# Patient Record
Sex: Female | Born: 1950 | Race: Black or African American | Hispanic: No | Marital: Single | Smoking: Former smoker
Health system: Southern US, Community
[De-identification: ages and names within clinical notes are randomized; demographics above are authoritative.]

## PROBLEM LIST (undated history)

## (undated) DIAGNOSIS — I10 Essential (primary) hypertension: Secondary | ICD-10-CM

## (undated) HISTORY — PX: TUBAL LIGATION: SHX77

---

## 2011-03-12 ENCOUNTER — Emergency Department (HOSPITAL_COMMUNITY)
Admission: EM | Admit: 2011-03-12 | Discharge: 2011-03-13 | Disposition: A | Discharge status: Home / Self Care | Payer: Self-pay | Attending: Emergency Medicine | Admitting: Emergency Medicine

## 2011-03-12 DIAGNOSIS — N739 Female pelvic inflammatory disease, unspecified: Secondary | ICD-10-CM | POA: Insufficient documentation

## 2011-03-13 LAB — URINALYSIS, ROUTINE W REFLEX MICROSCOPIC
Bilirubin Urine: NEGATIVE
Glucose, UA: NEGATIVE mg/dL
Leukocytes, UA: NEGATIVE
Nitrite: NEGATIVE
Specific Gravity, Urine: 1.026 (ref 1.005–1.030)
pH: 6 (ref 5.0–8.0)

## 2011-03-13 LAB — WET PREP, GENITAL: Trich, Wet Prep: NONE SEEN

## 2013-01-26 ENCOUNTER — Encounter (HOSPITAL_COMMUNITY): Payer: Self-pay | Admitting: *Deleted

## 2013-01-26 ENCOUNTER — Emergency Department (HOSPITAL_COMMUNITY)
Admission: EM | Admit: 2013-01-26 | Discharge: 2013-01-26 | Disposition: A | Discharge status: Home / Self Care | Payer: Self-pay | Attending: Emergency Medicine | Admitting: Emergency Medicine

## 2013-01-26 DIAGNOSIS — Z87891 Personal history of nicotine dependence: Secondary | ICD-10-CM | POA: Insufficient documentation

## 2013-01-26 DIAGNOSIS — L299 Pruritus, unspecified: Secondary | ICD-10-CM | POA: Insufficient documentation

## 2013-01-26 DIAGNOSIS — L259 Unspecified contact dermatitis, unspecified cause: Secondary | ICD-10-CM | POA: Insufficient documentation

## 2013-01-26 MED ORDER — PREDNISONE 20 MG PO TABS
60.0000 mg | ORAL_TABLET | Freq: Once | ORAL | Status: AC
Start: 2013-01-26 — End: 2013-01-26
  Administered 2013-01-26: 60 mg via ORAL
  Filled 2013-01-26: qty 3

## 2013-01-26 MED ORDER — PREDNISONE 10 MG PO TABS: 40.0000 mg | ORAL_TABLET | Freq: Every day | ORAL | Status: DC

## 2013-01-26 NOTE — ED Notes (Signed)
Pt states that she was out in the sun on Friday and while she was out in the sun she also had a chair massage; pt states that after the massage she noted a rash to her arms, chest and back; pt describes it as mildly itching, red raised bumps; pt states that she has taken an oatmeal type bath and taken Benadryl with no improvement; pt denies any respiratory symptoms with rash.

## 2013-01-26 NOTE — ED Provider Notes (Signed)
History     CSN: [id number]  Arrival date & time 01/26/13  2224   First MD Initiated Contact with Patient 01/26/13 2257      Chief Complaint  Patient presents with  . Rash    (Consider location/radiation/quality/duration/timing/severity/associated sxs/prior treatment) HPI Comments: Patient presents with rash. She states that she was out in the sun over the weekend and also who got a chair massage. She states that yesterday she developed a rash that has been increasingly red and itchy. She states it's on her arms and her trunk. She denies any swelling of her lips or tongue. She denies any shortness of breath. She has had some of allergic reactions in the past. She's tried Benadryl and over the past without improvement.  Patient is a 62 y.o. female presenting with rash.  Rash Associated symptoms: no abdominal pain, no diarrhea, no fatigue, no fever, no headaches, no joint pain, no nausea, no shortness of breath and not vomiting     History reviewed. No pertinent past medical history.  Past Surgical History  Procedure Laterality Date  . Tubal ligation      No family history on file.  History  Substance Use Topics  . Smoking status: Former Games developer  . Smokeless tobacco: Not on file  . Alcohol Use: No    OB History   Grav Para Term Preterm Abortions TAB SAB Ect Mult Living                  Review of Systems  Constitutional: Negative for fever, chills, diaphoresis and fatigue.  HENT: Negative for congestion, rhinorrhea and sneezing.   Eyes: Negative.   Respiratory: Negative for cough, chest tightness and shortness of breath.   Cardiovascular: Negative for chest pain and leg swelling.  Gastrointestinal: Negative for nausea, vomiting, abdominal pain, diarrhea and blood in stool.  Genitourinary: Negative for frequency, hematuria, flank pain and difficulty urinating.  Musculoskeletal: Negative for back pain and arthralgias.  Skin: Positive for rash.  Neurological: Negative  for dizziness, speech difficulty, weakness, numbness and headaches.    Allergies  Demerol  Home Medications   Current Outpatient Rx  Name  Route  Sig  Dispense  Refill  . predniSONE (DELTASONE) 10 MG tablet   Oral   Take 4 tablets (40 mg total) by mouth daily.   20 tablet   0     BP 160/110  Pulse 95  Temp(Src) 98.7 F (37.1 C) (Oral)  Resp 18  Ht 5\' 8"  (1.727 m)  Wt 162 lb (73.483 kg)  BMI 24.64 kg/m2  SpO2 98%  Physical Exam  Constitutional: She is oriented to person, place, and time. She appears well-developed and well-nourished.  HENT:  Head: Normocephalic and atraumatic.  Eyes: Pupils are equal, round, and reactive to light.  Neck: Normal range of motion. Neck supple.  Cardiovascular: Normal rate, regular rhythm and normal heart sounds.   Pulmonary/Chest: Effort normal and breath sounds normal. No respiratory distress. She has no wheezes. She has no rales. She exhibits no tenderness.  Abdominal: Soft. Bowel sounds are normal. There is no tenderness. There is no rebound and no guarding.  Musculoskeletal: Normal range of motion. She exhibits no edema.  Lymphadenopathy:    She has no cervical adenopathy.  Neurological: She is alert and oriented to person, place, and time.  Skin: Skin is warm and dry. Rash (Diffuse maculopapular type rash. It is blanching. It's confluent in areas. There's no petechiae purpura or vesicular lesions. There is no rash on  the palms and soles.) noted.  Psychiatric: She has a normal mood and affect.    ED Course  Procedures (including critical care time)  Labs Reviewed - No data to display No results found.   1. Contact dermatitis       MDM  Patient is given a short course of steroids. She was advised to followup with her primary care physician or return here as needed if her symptoms are not improving or for any worsening symptoms.        Rolan Bucco, MD 01/26/13 [phone]

## 2014-01-22 ENCOUNTER — Emergency Department (INDEPENDENT_AMBULATORY_CARE_PROVIDER_SITE_OTHER)
Admission: EM | Admit: 2014-01-22 | Discharge: 2014-01-22 | Disposition: A | Discharge status: Home / Self Care | Payer: Self-pay | Source: Home / Self Care | Attending: Emergency Medicine | Admitting: Emergency Medicine

## 2014-01-22 ENCOUNTER — Encounter (HOSPITAL_COMMUNITY): Payer: Self-pay | Admitting: Emergency Medicine

## 2014-01-22 DIAGNOSIS — L230 Allergic contact dermatitis due to metals: Secondary | ICD-10-CM

## 2014-01-22 DIAGNOSIS — I10 Essential (primary) hypertension: Secondary | ICD-10-CM

## 2014-01-22 MED ORDER — PREDNISONE 10 MG PO TABS: ORAL_TABLET | ORAL | Status: DC

## 2014-01-22 NOTE — ED Provider Notes (Signed)
CSN: [id number]     Arrival date & time 01/22/14  1824 History   First MD Initiated Contact with Patient 01/22/14 1904     Chief Complaint  Patient presents with  . Rash   (Consider location/radiation/quality/duration/timing/severity/associated sxs/prior Treatment) HPI Comments: Patient states she has had intermittent episodes of a red, raised pruritic rash over the past several weeks. She notices it around her neck when she wears certain pieces of jewelry. Recently she has noticed area on anterior abdomen after buying and wearing a new pair of blue jeans and area on midback after buying a new brassiere. Has occasionally taken a dose of prednisone (left from old Rx) for the rash and rash will then resolve.  Denies any involvement of face, lips or tongue.  States she has no visible rash today as most recent episode has faded, but wishes to be evaluated for the cause of the rash.   The history is provided by the patient.    History reviewed. No pertinent past medical history. Past Surgical History  Procedure Laterality Date  . Tubal ligation     History reviewed. No pertinent family history. History  Substance Use Topics  . Smoking status: Former Games developermoker  . Smokeless tobacco: Not on file  . Alcohol Use: No   OB History   Grav Para Term Preterm Abortions TAB SAB Ect Mult Living                 Review of Systems  All other systems reviewed and are negative.   Allergies  Demerol  Home Medications   Prior to Admission medications   Medication Sig Start Date End Date Taking? Authorizing Provider  predniSONE (DELTASONE) 10 MG tablet Take 4 tablets (40 mg total) by mouth daily. 01/26/13   Rolan BuccoMelanie Belfi, MD  predniSONE (DELTASONE) 10 MG tablet 1-2 tablets po QD as needed for allergic skin reaction 01/22/14   Jess BartersJennifer Lee Presson, PA   BP 187/114  Pulse 83  Temp(Src) 98.5 F (36.9 C) (Oral)  Resp 14  SpO2 96% Physical Exam  Nursing note and vitals reviewed. Constitutional: She  is oriented to person, place, and time. She appears well-developed and well-nourished. No distress.  HENT:  Head: Normocephalic and atraumatic.  Right Ear: Hearing and external ear normal.  Left Ear: Hearing and external ear normal.  Nose: Nose normal.  Mouth/Throat: Uvula is midline, oropharynx is clear and moist and mucous membranes are normal. No oral lesions. No trismus in the jaw. No uvula swelling.  Eyes: Conjunctivae are normal. No scleral icterus.  Neck: Normal range of motion. Neck supple.  Cardiovascular: Normal rate.   Pulmonary/Chest: Effort normal and breath sounds normal. No stridor. No respiratory distress. She has no wheezes.  Musculoskeletal: Normal range of motion.  Lymphadenopathy:    She has no cervical adenopathy.  Neurological: She is alert and oriented to person, place, and time.  Skin: Skin is warm and dry. No rash noted. No erythema.  Psychiatric: She has a normal mood and affect. Her behavior is normal.    ED Course  Procedures (including critical care time) Labs Review Labs Reviewed - No data to display  Imaging Review No results found.   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 The additional areas of rash on back and abdomen are the areas where metal brassiere clasp and metal button  on blue jeans contacts her skin. I advised her that she likely has a nickel sensitivity and cover those metal portions of her clothing with clear nail polish might help to eliminate the issue.  We also had discussion about her markedly elevated blood pressure and she states she was not aware of this condition and she would follow up with her PCP as soon as possible.     Jess BartersJennifer Lee McKinleyvillePresson, [place] 01/26/14 [phone]

## 2014-01-22 NOTE — ED Notes (Signed)
C/o rash on body States the rash was all over body until she showered and wash with vinegar and used prednisone.   States rash came back this afternoon on bottom of back, by breast and panty liner

## 2014-01-22 NOTE — Discharge Instructions (Signed)
Please be aware that your blood pressure is quite elevated (187/114) at today's visit and I would like you to follow up with your primary care doctor for re-evaluation and possible treatment in the next 7-10 days.   Contact Dermatitis Contact dermatitis is a reaction to certain substances that touch the skin. Contact dermatitis can be either irritant contact dermatitis or allergic contact dermatitis. Irritant contact dermatitis does not require previous exposure to the substance for a reaction to occur.Allergic contact dermatitis only occurs if you have been exposed to the substance before. Upon a repeat exposure, your body reacts to the substance.  CAUSES  Many substances can cause contact dermatitis. Irritant dermatitis is most commonly caused by repeated exposure to mildly irritating substances, such as:  Makeup.  Soaps.  Detergents.  Bleaches.  Acids.  Metal salts, such as nickel. Allergic contact dermatitis is most commonly caused by exposure to:  Poisonous plants.  Chemicals (deodorants, shampoos).  Jewelry.  Latex.  Neomycin in triple antibiotic cream.  Preservatives in products, including clothing. SYMPTOMS  The area of skin that is exposed may develop:  Dryness or flaking.  Redness.  Cracks.  Itching.  Pain or a burning sensation.  Blisters. With allergic contact dermatitis, there may also be swelling in areas such as the eyelids, mouth, or genitals.  DIAGNOSIS  Your caregiver can usually tell what the problem is by doing a physical exam. In cases where the cause is uncertain and an allergic contact dermatitis is suspected, a patch skin test may be performed to help determine the cause of your dermatitis. TREATMENT Treatment includes protecting the skin from further contact with the irritating substance by avoiding that substance if possible. Barrier creams, powders, and gloves may be helpful. Your caregiver may also recommend:  Steroid creams or ointments  applied 2 times daily. For best results, soak the rash area in cool water for 20 minutes. Then apply the medicine. Cover the area with a plastic wrap. You can store the steroid cream in the refrigerator for a "chilly" effect on your rash. That may decrease itching. Oral steroid medicines may be needed in more severe cases.  Antibiotics or antibacterial ointments if a skin infection is present.  Antihistamine lotion or an antihistamine taken by mouth to ease itching.  Lubricants to keep moisture in your skin.  Burow's solution to reduce redness and soreness or to dry a weeping rash. Mix one packet or tablet of solution in 2 cups cool water. Dip a clean washcloth in the mixture, wring it out a bit, and put it on the affected area. Leave the cloth in place for 30 minutes. Do this as often as possible throughout the day.  Taking several cornstarch or baking soda baths daily if the area is too large to cover with a washcloth. Harsh chemicals, such as alkalis or acids, can cause skin damage that is like a burn. You should flush your skin for 15 to 20 minutes with cold water after such an exposure. You should also seek immediate medical care after exposure. Bandages (dressings), antibiotics, and pain medicine may be needed for severely irritated skin.  HOME CARE INSTRUCTIONS  Avoid the substance that caused your reaction.  Keep the area of skin that is affected away from hot water, soap, sunlight, chemicals, acidic substances, or anything else that would irritate your skin.  Do not scratch the rash. Scratching may cause the rash to become infected.  You may take cool baths to help stop the itching.  Only take  over-the-counter or prescription medicines as directed by your caregiver.  See your caregiver for follow-up care as directed to make sure your skin is healing properly. SEEK MEDICAL CARE IF:   Your condition is not better after 3 days of treatment.  You seem to be getting worse.  You see  signs of infection such as swelling, tenderness, redness, soreness, or warmth in the affected area.  You have any problems related to your medicines. Document Released: 08/24/2000 Document Revised: 11/19/2011 Document Reviewed: 01/30/2011 [facility] Patient Information 2014 [place], [place].  Hypertension As your heart beats, it forces blood through your arteries. This force is your blood pressure. If the pressure is too high, it is called hypertension (HTN) or high blood pressure. HTN is dangerous because you may have it and not know it. High blood pressure may mean that your heart has to work harder to pump blood. Your arteries may be narrow or stiff. The extra work puts you at risk for heart disease, stroke, and other problems.  Blood pressure consists of two numbers, a higher number over a lower, 110/72, for example. It is stated as "110 over 72." The ideal is below 120 for the top number (systolic) and under 80 for the bottom (diastolic). Write down your blood pressure today. You should pay close attention to your blood pressure if you have certain conditions such as:  Heart failure.  Prior heart attack.  Diabetes  Chronic kidney disease.  Prior stroke.  Multiple risk factors for heart disease. To see if you have HTN, your blood pressure should be measured while you are seated with your arm held at the level of the heart. It should be measured at least twice. A one-time elevated blood pressure reading (especially in the Emergency Department) does not mean that you need treatment. There may be conditions in which the blood pressure is different between your right and left arms. It is important to see your caregiver soon for a recheck. Most people have essential hypertension which means that there is not a specific cause. This type of high blood pressure may be lowered by changing lifestyle factors such as:  Stress.  Smoking.  Lack of exercise.  Excessive  weight.  Drug/tobacco/alcohol use.  Eating less salt. Most people do not have symptoms from high blood pressure until it has caused damage to the body. Effective treatment can often prevent, delay or reduce that damage. TREATMENT  When a cause has been identified, treatment for high blood pressure is directed at the cause. There are a large number of medications to treat HTN. These fall into several categories, and your caregiver will help you select the medicines that are best for you. Medications may have side effects. You should review side effects with your caregiver. If your blood pressure stays high after you have made lifestyle changes or started on medicines,   Your medication(s) may need to be changed.  Other problems may need to be addressed.  Be certain you understand your prescriptions, and know how and when to take your medicine.  Be sure to follow up with your caregiver within the time frame advised (usually within two weeks) to have your blood pressure rechecked and to review your medications.  If you are taking more than one medicine to lower your blood pressure, make sure you know how and at what times they should be taken. Taking two medicines at the same time can result in blood pressure that is too low. SEEK IMMEDIATE MEDICAL CARE IF:  You develop a severe headache, blurred or changing vision, or confusion.  You have unusual weakness or numbness, or a faint feeling.  You have severe chest or abdominal pain, vomiting, or breathing problems. MAKE SURE YOU:   Understand these instructions.  Will watch your condition.  Will get help right away if you are not doing well or get worse. Document Released: 08/27/2005 Document Revised: 11/19/2011 Document Reviewed: 04/16/2008 [facility] Patient Information 2014 [place].

## 2014-01-26 NOTE — ED Provider Notes (Signed)
Medical screening examination/treatment/procedure(s) were performed by non-physician practitioner and as supervising physician I was immediately available for consultation/collaboration.  Leslee Homeavid Jamarco Zaldivar, M.D.  Reuben Likesavid C Camelia Stelzner, MD 01/26/14 [phone]

## 2014-06-25 ENCOUNTER — Encounter (HOSPITAL_COMMUNITY): Payer: Self-pay | Admitting: Emergency Medicine

## 2014-06-25 ENCOUNTER — Emergency Department (HOSPITAL_COMMUNITY)
Admission: EM | Admit: 2014-06-25 | Discharge: 2014-06-25 | Disposition: A | Discharge status: Home / Self Care | Payer: Self-pay | Attending: Emergency Medicine | Admitting: Emergency Medicine

## 2014-06-25 ENCOUNTER — Other Ambulatory Visit: Payer: Self-pay

## 2014-06-25 ENCOUNTER — Emergency Department (HOSPITAL_COMMUNITY): Payer: Self-pay

## 2014-06-25 DIAGNOSIS — I10 Essential (primary) hypertension: Secondary | ICD-10-CM | POA: Insufficient documentation

## 2014-06-25 DIAGNOSIS — Z87891 Personal history of nicotine dependence: Secondary | ICD-10-CM | POA: Insufficient documentation

## 2014-06-25 DIAGNOSIS — Z79899 Other long term (current) drug therapy: Secondary | ICD-10-CM | POA: Insufficient documentation

## 2014-06-25 DIAGNOSIS — I160 Hypertensive urgency: Secondary | ICD-10-CM

## 2014-06-25 HISTORY — DX: Essential (primary) hypertension: I10

## 2014-06-25 LAB — URINALYSIS, ROUTINE W REFLEX MICROSCOPIC
Bilirubin Urine: NEGATIVE
GLUCOSE, UA: NEGATIVE mg/dL
HGB URINE DIPSTICK: NEGATIVE
Ketones, ur: NEGATIVE mg/dL
Nitrite: NEGATIVE
PROTEIN: NEGATIVE mg/dL
SPECIFIC GRAVITY, URINE: 1.005 (ref 1.005–1.030)
Urobilinogen, UA: 0.2 mg/dL (ref 0.0–1.0)
pH: 7 (ref 5.0–8.0)

## 2014-06-25 LAB — BASIC METABOLIC PANEL
Anion gap: 12 (ref 5–15)
BUN: 13 mg/dL (ref 6–23)
CHLORIDE: 104 meq/L (ref 96–112)
CO2: 25 meq/L (ref 19–32)
Calcium: 9.6 mg/dL (ref 8.4–10.5)
Creatinine, Ser: 0.84 mg/dL (ref 0.50–1.10)
GFR calc Af Amer: 85 mL/min — ABNORMAL LOW (ref >90)
GFR calc non Af Amer: 74 mL/min — ABNORMAL LOW (ref >90)
GLUCOSE: 117 mg/dL — AB (ref 70–99)
POTASSIUM: 4.1 meq/L (ref 3.7–5.3)
SODIUM: 141 meq/L (ref 137–147)

## 2014-06-25 LAB — CBC WITH DIFFERENTIAL/PLATELET
Basophils Absolute: 0 10*3/uL (ref 0.0–0.1)
Basophils Relative: 0 % (ref 0–1)
Eosinophils Absolute: 0.1 10*3/uL (ref 0.0–0.7)
Eosinophils Relative: 1 % (ref 0–5)
HCT: 42.8 % (ref 36.0–46.0)
HEMOGLOBIN: 14.8 g/dL (ref 12.0–15.0)
LYMPHS ABS: 1.7 10*3/uL (ref 0.7–4.0)
LYMPHS PCT: 21 % (ref 12–46)
MCH: 29.8 pg (ref 26.0–34.0)
MCHC: 34.6 g/dL (ref 30.0–36.0)
MCV: 86.1 fL (ref 78.0–100.0)
MONOS PCT: 4 % (ref 3–12)
Monocytes Absolute: 0.4 10*3/uL (ref 0.1–1.0)
NEUTROS ABS: 6.1 10*3/uL (ref 1.7–7.7)
NEUTROS PCT: 74 % (ref 43–77)
PLATELETS: 174 10*3/uL (ref 150–400)
RBC: 4.97 MIL/uL (ref 3.87–5.11)
RDW: 13.3 % (ref 11.5–15.5)
WBC: 8.3 10*3/uL (ref 4.0–10.5)

## 2014-06-25 LAB — URINE MICROSCOPIC-ADD ON

## 2014-06-25 NOTE — ED Provider Notes (Signed)
CSN: [id number]     Arrival date & time 06/25/14  [id number] History   First MD Initiated Contact with Patient 06/25/14 [phone]     Chief Complaint  Patient presents with  . Tingling     (Consider location/radiation/quality/duration/timing/severity/associated sxs/prior Treatment) Patient is a 63 y.o. female presenting with general illness.  Illness Location:  R arm and leg, L sided vision problem Quality:  Tingling Severity:  Mild Onset quality:  Gradual Timing:  Intermittent Progression:  Waxing and waning Chronicity:  New Relieved by:  Nothing Worsened by:  Nothing Associated symptoms: no chest pain, no cough, no loss of consciousness, no nausea and no vomiting     Past Medical History  Diagnosis Date  . Hypertension    Past Surgical History  Procedure Laterality Date  . Tubal ligation     No family history on file. History  Substance Use Topics  . Smoking status: Former Games developermoker  . Smokeless tobacco: Not on file  . Alcohol Use: No   OB History   Grav Para Term Preterm Abortions TAB SAB Ect Mult Living                 Review of Systems  Respiratory: Negative for cough.   Cardiovascular: Negative for chest pain.  Gastrointestinal: Negative for nausea and vomiting.  Neurological: Negative for loss of consciousness.  All other systems reviewed and are negative.     Allergies  Demerol  Home Medications   Prior to Admission medications   Medication Sig Start Date End Date Taking? Authorizing Provider  BIOTIN PO Take 2 tablets by mouth daily.   Yes Historical Provider, MD  Cholecalciferol (VITAMIN D PO) Take 1 Applicatorful by mouth daily.   Yes Historical Provider, MD  Cyanocobalamin (VITAMIN B-12 PO) Take 1 Applicatorful by mouth daily.   Yes Historical Provider, MD  Ginger, Zingiber officinalis, (GINGER PO) Take 2 tablets by mouth daily.   Yes Historical Provider, MD  Kava, Piper methysticum, (KAVA KAVA PO) Take 2 tablets by mouth daily.   Yes Historical  Provider, MD  Nutritional Supplements (JUICE PLUS FIBRE PO) Take 2 tablets by mouth daily.   Yes Historical Provider, MD  TAURINE PO Take 1 tablet by mouth daily.   Yes Historical Provider, MD  TURMERIC PO Take 2 tablets by mouth daily.   Yes Historical Provider, MD   BP 147/87  Pulse 77  Temp(Src) 98 F (36.7 C) (Oral)  Resp 16  Ht 5\' 8"  (1.727 m)  Wt 165 lb (74.844 kg)  BMI 25.09 kg/m2  SpO2 96% Physical Exam  Vitals reviewed. Constitutional: She is oriented to person, place, and time. She appears well-developed and well-nourished.  HENT:  Head: Normocephalic and atraumatic.  Right Ear: External ear normal.  Left Ear: External ear normal.  Eyes: Conjunctivae and EOM are normal. Pupils are equal, round, and reactive to light.  Neck: Normal range of motion. Neck supple.  Cardiovascular: Normal rate, regular rhythm, normal heart sounds and intact distal pulses.   Pulmonary/Chest: Effort normal and breath sounds normal.  Abdominal: Soft. Bowel sounds are normal. There is no tenderness.  Musculoskeletal: Normal range of motion.  Neurological: She is alert and oriented to person, place, and time. She has normal strength and normal reflexes. No cranial nerve deficit or sensory deficit. Gait normal.  Skin: Skin is warm and dry.    ED Course  Procedures (including critical care time) Labs Review Labs Reviewed  BASIC METABOLIC PANEL - Abnormal; Notable for the following:  Glucose, Bld 117 (*)    GFR calc non Af Amer 74 (*)    GFR calc Af Amer 85 (*)    All other components within normal limits  URINALYSIS, ROUTINE W REFLEX MICROSCOPIC - Abnormal; Notable for the following:    Leukocytes, UA SMALL (*)    All other components within normal limits  CBC WITH DIFFERENTIAL  URINE MICROSCOPIC-ADD ON    Imaging Review Dg Chest 2 View  06/25/2014   CLINICAL DATA:  Initial evaluation for tingling.  EXAM: CHEST  2 VIEW  COMPARISON:  None.  FINDINGS: The cardiac and mediastinal  silhouettes are within normal limits.  The lungs are normally inflated. No airspace consolidation, pleural effusion, or pulmonary edema is identified. There is no pneumothorax.  No acute osseous abnormality identified.  IMPRESSION: No active cardiopulmonary disease.   Electronically Signed   By: Rise MuBenjamin  McClintock M.D.   On: 06/25/2014 04:36   Ct Head Wo Contrast  06/25/2014   CLINICAL DATA:  Initial evaluation for headache, right facial tingling.  EXAM: CT HEAD WITHOUT CONTRAST  TECHNIQUE: Contiguous axial images were obtained from the base of the skull through the vertex without intravenous contrast.  COMPARISON:  None.  FINDINGS: There is no acute intracranial hemorrhage or infarct. No mass lesion or midline shift. Gray-white matter differentiation is well maintained. Ventricles are normal in size without evidence of hydrocephalus. CSF containing spaces are within normal limits. No extra-axial fluid collection.  The calvarium is intact.  Orbital soft tissues are within normal limits.  The paranasal sinuses and mastoid air cells are well pneumatized and free of fluid.  Scalp soft tissues are unremarkable.  IMPRESSION: Normal head CT with no acute intracranial process identified.   Electronically Signed   By: Rise MuBenjamin  McClintock M.D.   On: 06/25/2014 05:55     EKG Interpretation   Date/Time:  Friday June 25 2014 03:56:53 EDT Ventricular Rate:  79 PR Interval:  158 QRS Duration: 84 QT Interval:  372 QTC Calculation: 426 R Axis:   71 Text Interpretation:  Sinus rhythm rate has decreased Confirmed by Mirian MoGentry,  Clayson Riling [phone]([id number]) on 06/25/2014 4:05:16 AM      MDM   Final diagnoses:  Hypertensive urgency    63 y.o. female with pertinent PMH of HTN presents with R sided arm and leg tingling, L sided peripheral vision loss.  Symptoms ongoing for weeks intermittently, pt has loss of peripheral vision yesterday which was prompt for visit.  She is asymptomatic at time of my examination.  On  arrival today, vitals and physical exam as above, no focal neuro deficits.  Labs and imaging as above unremarkable.  Discussed case with neurology and feel that symptoms of right sided tingling with L sided peripheral vision loss do not coincide with likely TIA.  Will have pt fu with outpt neurology.  DC home with strict return precautions.    1. Hypertensive urgency         Mirian MoMatthew Delwyn Scoggin, MD 06/25/14 [phone]

## 2014-06-25 NOTE — ED Notes (Addendum)
Pt reports earlier yesterday she wasn't feeling well and c/o headache and she had flashes of light in her left eye. She went to sleep at 11pm and woke up with tingling to the R side of her face. Pt sts her bp has been elevated x 2 days. She does not take medication for bp. Denies speech issues or confusion. Grip strengths equal. Pt took 325 asa pta.

## 2014-06-25 NOTE — Discharge Instructions (Signed)
Hypertension °Hypertension, commonly called high blood pressure, is when the force of blood pumping through your arteries is too strong. Your arteries are the blood vessels that carry blood from your heart throughout your body. A blood pressure reading consists of a higher number over a lower number, such as 110/72. The higher number (systolic) is the pressure inside your arteries when your heart pumps. The lower number (diastolic) is the pressure inside your arteries when your heart relaxes. Ideally you want your blood pressure below 120/80. °Hypertension forces your heart to work harder to pump blood. Your arteries may become narrow or stiff. Having hypertension puts you at risk for heart disease, stroke, and other problems.  °RISK FACTORS °Some risk factors for high blood pressure are controllable. Others are not.  °Risk factors you cannot control include:  °· Race. You may be at higher risk if you are African American. °· Age. Risk increases with age. °· Gender. Men are at higher risk than women before age 45 years. After age 65, women are at higher risk than men. °Risk factors you can control include: °· Not getting enough exercise or physical activity. °· Being overweight. °· Getting too much fat, sugar, calories, or salt in your diet. °· Drinking too much alcohol. °SIGNS AND SYMPTOMS °Hypertension does not usually cause signs or symptoms. Extremely high blood pressure (hypertensive crisis) may cause headache, anxiety, shortness of breath, and nosebleed. °DIAGNOSIS  °To check if you have hypertension, your health care provider will measure your blood pressure while you are seated, with your arm held at the level of your heart. It should be measured at least twice using the same arm. Certain conditions can cause a difference in blood pressure between your right and left arms. A blood pressure reading that is higher than normal on one occasion does not mean that you need treatment. If one blood pressure reading  is high, ask your health care provider about having it checked again. °TREATMENT  °Treating high blood pressure includes making lifestyle changes and possibly taking medicine. Living a healthy lifestyle can help lower high blood pressure. You may need to change some of your habits. °Lifestyle changes may include: °· Following the DASH diet. This diet is high in fruits, vegetables, and whole grains. It is low in salt, red meat, and added sugars. °· Getting at least 2½ hours of brisk physical activity every week. °· Losing weight if necessary. °· Not smoking. °· Limiting alcoholic beverages. °· Learning ways to reduce stress. ° If lifestyle changes are not enough to get your blood pressure under control, your health care provider may prescribe medicine. You may need to take more than one. Work closely with your health care provider to understand the risks and benefits. °HOME CARE INSTRUCTIONS °· Have your blood pressure rechecked as directed by your health care provider.   °· Take medicines only as directed by your health care provider. Follow the directions carefully. Blood pressure medicines must be taken as prescribed. The medicine does not work as well when you skip doses. Skipping doses also puts you at risk for problems.   °· Do not smoke.   °· Monitor your blood pressure at home as directed by your health care provider.  °SEEK MEDICAL CARE IF:  °· You think you are having a reaction to medicines taken. °· You have recurrent headaches or feel dizzy. °· You have swelling in your ankles. °· You have trouble with your vision. °SEEK IMMEDIATE MEDICAL CARE IF: °· You develop a severe headache or confusion. °·   You have unusual weakness, numbness, or feel faint.  You have severe chest or abdominal pain.  You vomit repeatedly.  You have trouble breathing. MAKE SURE YOU:   Understand these instructions.  Will watch your condition.  Will get help right away if you are not doing well or get worse. Document  Released: 08/27/2005 Document Revised: 01/11/2014 Document Reviewed: 06/19/2013 [facility] Patient Information 2015 [employer], [place]. This information is not intended to replace advice given to you by your health care provider. Make sure you discuss any questions you have with your health care provider. Visual Disturbances You have had a disturbance in your vision. This may be caused by various conditions, such as:  Migraines. Migraine headaches are often preceded by a disturbance in vision. Blind spots or light flashes are followed by a headache. This type of visual disturbance is temporary. It does not damage the eye.  Glaucoma. This is caused by increased pressure in the eye. Symptoms include haziness, blurred vision, or seeing rainbow colored circles when looking at bright lights. Partial or complete visual loss can occur. You may or may not experience eye pain. Visual loss may be gradual or sudden and is irreversible. Glaucoma is the leading cause of blindness.  Retina problems. Vision will be reduced if the retina becomes detached or if there is a circulation problem as with diabetes, high blood pressure, or a mini-stroke. Symptoms include seeing "floaters," flashes of light, or shadows, as if a curtain has fallen over your eye.  Optic nerve problems. The main nerve in your eye can be damaged by redness, soreness, and swelling (inflammation), poor circulation, drugs, and toxins. It is very important to have a complete exam done by a specialist to determine the exact cause of your eye problem. The specialist may recommend medicines or surgery, depending on the cause of the problem. This can help prevent further loss of vision or reduce the risk of having a stroke. Contact the caregiver to whom you have been referred and arrange for follow-up care right away. SEEK IMMEDIATE MEDICAL CARE IF:   Your vision gets worse.  You develop severe headaches.  You have any weakness or numbness in the face,  arms, or legs.  You have any trouble speaking or walking. Document Released: 10/04/2004 Document Revised: 11/19/2011 Document Reviewed: 01/25/2010 [facility] Patient Information 2015 [place], [place]. This information is not intended to replace advice given to you by your health care provider. Make sure you discuss any questions you have with your health care provider.

## 2016-05-06 ENCOUNTER — Emergency Department (HOSPITAL_COMMUNITY)
Admission: EM | Admit: 2016-05-06 | Discharge: 2016-05-06 | Disposition: A | Discharge status: Home / Self Care | Payer: Self-pay | Attending: Emergency Medicine | Admitting: Emergency Medicine

## 2016-05-06 ENCOUNTER — Emergency Department (HOSPITAL_COMMUNITY): Payer: Self-pay

## 2016-05-06 ENCOUNTER — Encounter (HOSPITAL_COMMUNITY): Payer: Self-pay | Admitting: Emergency Medicine

## 2016-05-06 DIAGNOSIS — Z87891 Personal history of nicotine dependence: Secondary | ICD-10-CM | POA: Insufficient documentation

## 2016-05-06 DIAGNOSIS — I10 Essential (primary) hypertension: Secondary | ICD-10-CM | POA: Insufficient documentation

## 2016-05-06 LAB — BASIC METABOLIC PANEL
Anion gap: 7 (ref 5–15)
BUN: 10 mg/dL (ref 6–20)
CHLORIDE: 106 mmol/L (ref 101–111)
CO2: 27 mmol/L (ref 22–32)
CREATININE: 0.79 mg/dL (ref 0.44–1.00)
Calcium: 9.6 mg/dL (ref 8.9–10.3)
GFR calc Af Amer: >60 mL/min (ref >60)
GFR calc non Af Amer: >60 mL/min (ref >60)
Glucose, Bld: 115 mg/dL — ABNORMAL HIGH (ref 65–99)
POTASSIUM: 3.9 mmol/L (ref 3.5–5.1)
Sodium: 140 mmol/L (ref 135–145)

## 2016-05-06 LAB — CBC
HEMATOCRIT: 44.6 % (ref 36.0–46.0)
HEMOGLOBIN: 15.1 g/dL — AB (ref 12.0–15.0)
MCH: 29.8 pg (ref 26.0–34.0)
MCHC: 33.9 g/dL (ref 30.0–36.0)
MCV: 88 fL (ref 78.0–100.0)
Platelets: 212 10*3/uL (ref 150–400)
RBC: 5.07 MIL/uL (ref 3.87–5.11)
RDW: 13.4 % (ref 11.5–15.5)
WBC: 9.7 10*3/uL (ref 4.0–10.5)

## 2016-05-06 LAB — I-STAT TROPONIN, ED: TROPONIN I, POC: 0 ng/mL (ref 0.00–0.08)

## 2016-05-06 NOTE — ED Triage Notes (Signed)
Patient took 325 mg aspirin on her own.

## 2016-05-06 NOTE — ED Triage Notes (Addendum)
Per EMS c/o feeling "off" x 3 days, states she thinks she is having a heart attack. CP when talking. Pt just started hydrochlorothiazide today for HTN. Pt c/o anxiety. Hx Marfan. No back pain, shoulder pain.

## 2016-05-06 NOTE — ED Notes (Signed)
PT DISCHARGED. INSTRUCTIONS GIVEN. AAOX4. PT IN NO APPARENT DISTRESS OR PAIN. THE OPPORTUNITY TO ASK QUESTIONS WAS PROVIDED. 

## 2016-05-06 NOTE — Discharge Instructions (Signed)
Return here at once if you develop severe tearing pain in your chest, persistent fast heart rate, regular every going to pass out, or used until yourself. Follow up with your doctor next week to have your blood pressure medication adjusted

## 2016-05-06 NOTE — ED Provider Notes (Signed)
[facility]-EMERGENCY DEPT Provider Note   CSN: [id number] Arrival date & time: 05/06/16  1442     History   Chief Complaint Chief Complaint  Patient presents with  . Weakness    HPI Caroline Watson is a 65 y.o. female.  65 year old female presents with having chest discomfort that is worse when she is talking. Also notes increased blood pressure at home. Has been prescribed Hydrocort thiazide which she took her first dose today. Have been treating high blood pressure with natural medications. She denies any substernal chest pain. No diaphoresis or dyspnea. 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 Denies any numbness to her arms. No recent cough or congestion.      Past Medical History:  Diagnosis Date  . Hypertension     There are no active problems to display for this patient.   Past Surgical History:  Procedure Laterality Date  . TUBAL LIGATION      OB History    No data available       Home Medications    Prior to Admission medications   Medication Sig Start Date End Date Taking? Authorizing Provider  BIOTIN PO Take 2 tablets by mouth daily.    Historical Provider, MD  Cholecalciferol (VITAMIN D PO) Take 1 Applicatorful by mouth daily.    Historical Provider, MD  Cyanocobalamin (VITAMIN B-12 PO) Take 1 Applicatorful by mouth daily.    Historical Provider, MD  Ginger, Zingiber officinalis, (GINGER PO) Take 2 tablets by mouth daily.    Historical Provider, MD  Kava, Piper methysticum, (KAVA KAVA PO) Take 2 tablets by mouth daily.    Historical Provider, MD  Nutritional Supplements (JUICE PLUS FIBRE PO) Take 2 tablets by mouth daily.    Historical Provider, MD  TAURINE PO Take 1 tablet by mouth daily.    Historical Provider, MD  TURMERIC PO Take 2 tablets by mouth daily.    Historical Provider, MD    Family History History reviewed. No pertinent family history.  Social History Social History  Substance Use Topics   . Smoking status: Former Games developer  . Smokeless tobacco: Not on file  . Alcohol use No     Allergies   Demerol [meperidine]   Review of Systems Review of Systems  All other systems reviewed and are negative.    Physical Exam Updated Vital Signs BP (!) 168/104   Pulse 96   Temp 98.6 F (37 C)   Resp 19   SpO2 96%   Physical Exam  Constitutional: She is oriented to person, place, and time. She appears well-developed and well-nourished.  Non-toxic appearance. No distress.  HENT:  Head: Normocephalic and atraumatic.  Eyes: Conjunctivae, EOM and lids are normal. Pupils are equal, round, and reactive to light.  Neck: Normal range of motion. Neck supple. No tracheal deviation present. No thyroid mass present.  Cardiovascular: Normal rate, regular rhythm and normal heart sounds.  Exam reveals no gallop.   No murmur heard. Pulmonary/Chest: Effort normal and breath sounds normal. No stridor. No respiratory distress. She has no decreased breath sounds. She has no wheezes. She has no rhonchi. She has no rales.  Abdominal: Soft. Normal appearance and bowel sounds are normal. She exhibits no distension. There is no tenderness. There is no rebound and no CVA tenderness.  Musculoskeletal: Normal range of motion. She exhibits no edema or tenderness.  Neurological: She is alert and oriented to person, place, and time. She has normal strength.  No cranial nerve deficit or sensory deficit. GCS eye subscore is 4. GCS verbal subscore is 5. GCS motor subscore is 6.  Skin: Skin is warm and dry. No abrasion and no rash noted.  Psychiatric: She has a normal mood and affect. Her speech is normal and behavior is normal.  Nursing note and vitals reviewed.    ED Treatments / Results  Labs (all labs ordered are listed, but only abnormal results are displayed) Labs Reviewed  BASIC METABOLIC PANEL - Abnormal; Notable for the following:       Result Value   Glucose, Bld 115 (*)    All other components  within normal limits  CBC - Abnormal; Notable for the following:    Hemoglobin 15.1 (*)    All other components within normal limits  I-STAT TROPOININ, ED    EKG  EKG Interpretation  Date/Time:  Sunday May 06 2016 15:19:53 EDT Ventricular Rate:  83 PR Interval:    QRS Duration: 78 QT Interval:  355 QTC Calculation: 418 R Axis:   76 Text Interpretation:  Sinus rhythm Probable left atrial enlargement No significant change since last tracing Confirmed by Jahzir Strohmeier  MD, Flynt Breeze ([id number]) on 05/06/2016 4:08:17 PM       Radiology Dg Chest 2 View  Result Date: 05/06/2016 CLINICAL DATA:  Mid chest pain with difficulty breathing. On medications for hypertension. EXAM: CHEST  2 VIEW COMPARISON:  06/25/2014. FINDINGS: The heart size and mediastinal contours are stable. The heart size remains accentuated by a pectus excavatum deformity. The lungs are clear. There is no pleural effusion or pneumothorax. No acute osseous findings are seen. IMPRESSION: Stable chest.  No active cardiopulmonary process. Electronically Signed   By: Carey BullocksWilliam  Veazey M.D.   On: 05/06/2016 15:56    Procedures Procedures (including critical care time)  Medications Ordered in ED Medications - No data to display   Initial Impression / Assessment and Plan / ED Course  I have reviewed the triage vital signs and the nursing notes.  Pertinent labs & imaging results that were available during my care of the patient were reviewed by me and considered in my medical decision making (see chart for details).  Clinical Course    Patient's blood pressure noted here and patient encouraged to continue taking her high blood pressure medication. She is encouraged to follow-up with her doctor tomorrow  Final Clinical Impressions(s) / ED Diagnoses   Final diagnoses:  None    New Prescriptions New Prescriptions   No medications on file     Lorre NickAnthony Lysa Livengood, MD 05/06/16 1645

## 2016-05-06 NOTE — ED Notes (Signed)
Patient transported to X-ray unable to collect labs 

## 2016-05-07 ENCOUNTER — Other Ambulatory Visit (HOSPITAL_COMMUNITY): Payer: Self-pay | Admitting: Internal Medicine

## 2016-05-07 DIAGNOSIS — Q8740 Marfan's syndrome, unspecified: Secondary | ICD-10-CM

## 2016-05-15 ENCOUNTER — Ambulatory Visit (HOSPITAL_COMMUNITY)
Admission: RE | Admit: 2016-05-15 | Discharge: 2016-05-15 | Disposition: A | Discharge status: Home / Self Care | Payer: Self-pay | Source: Ambulatory Visit | Attending: Internal Medicine | Admitting: Internal Medicine

## 2016-05-15 DIAGNOSIS — Q8740 Marfan's syndrome, unspecified: Secondary | ICD-10-CM | POA: Insufficient documentation

## 2016-05-15 DIAGNOSIS — R531 Weakness: Secondary | ICD-10-CM | POA: Insufficient documentation

## 2016-05-15 DIAGNOSIS — I119 Hypertensive heart disease without heart failure: Secondary | ICD-10-CM | POA: Insufficient documentation

## 2016-05-15 NOTE — Progress Notes (Signed)
*  PRELIMINARY RESULTS* Echocardiogram 2D Echocardiogram has been performed.  Caroline Watson, Caroline Watson 05/15/2016, 1:30 PM

## 2017-03-13 IMAGING — CR DG CHEST 2V
2 series · 2 of 2 positions shown · non-contrast
Comparison: 06/25/2014.

CLINICAL DATA: Mid chest pain with difficulty breathing. On
medications for hypertension.

EXAM:
CHEST  2 VIEW

[w chest pa]
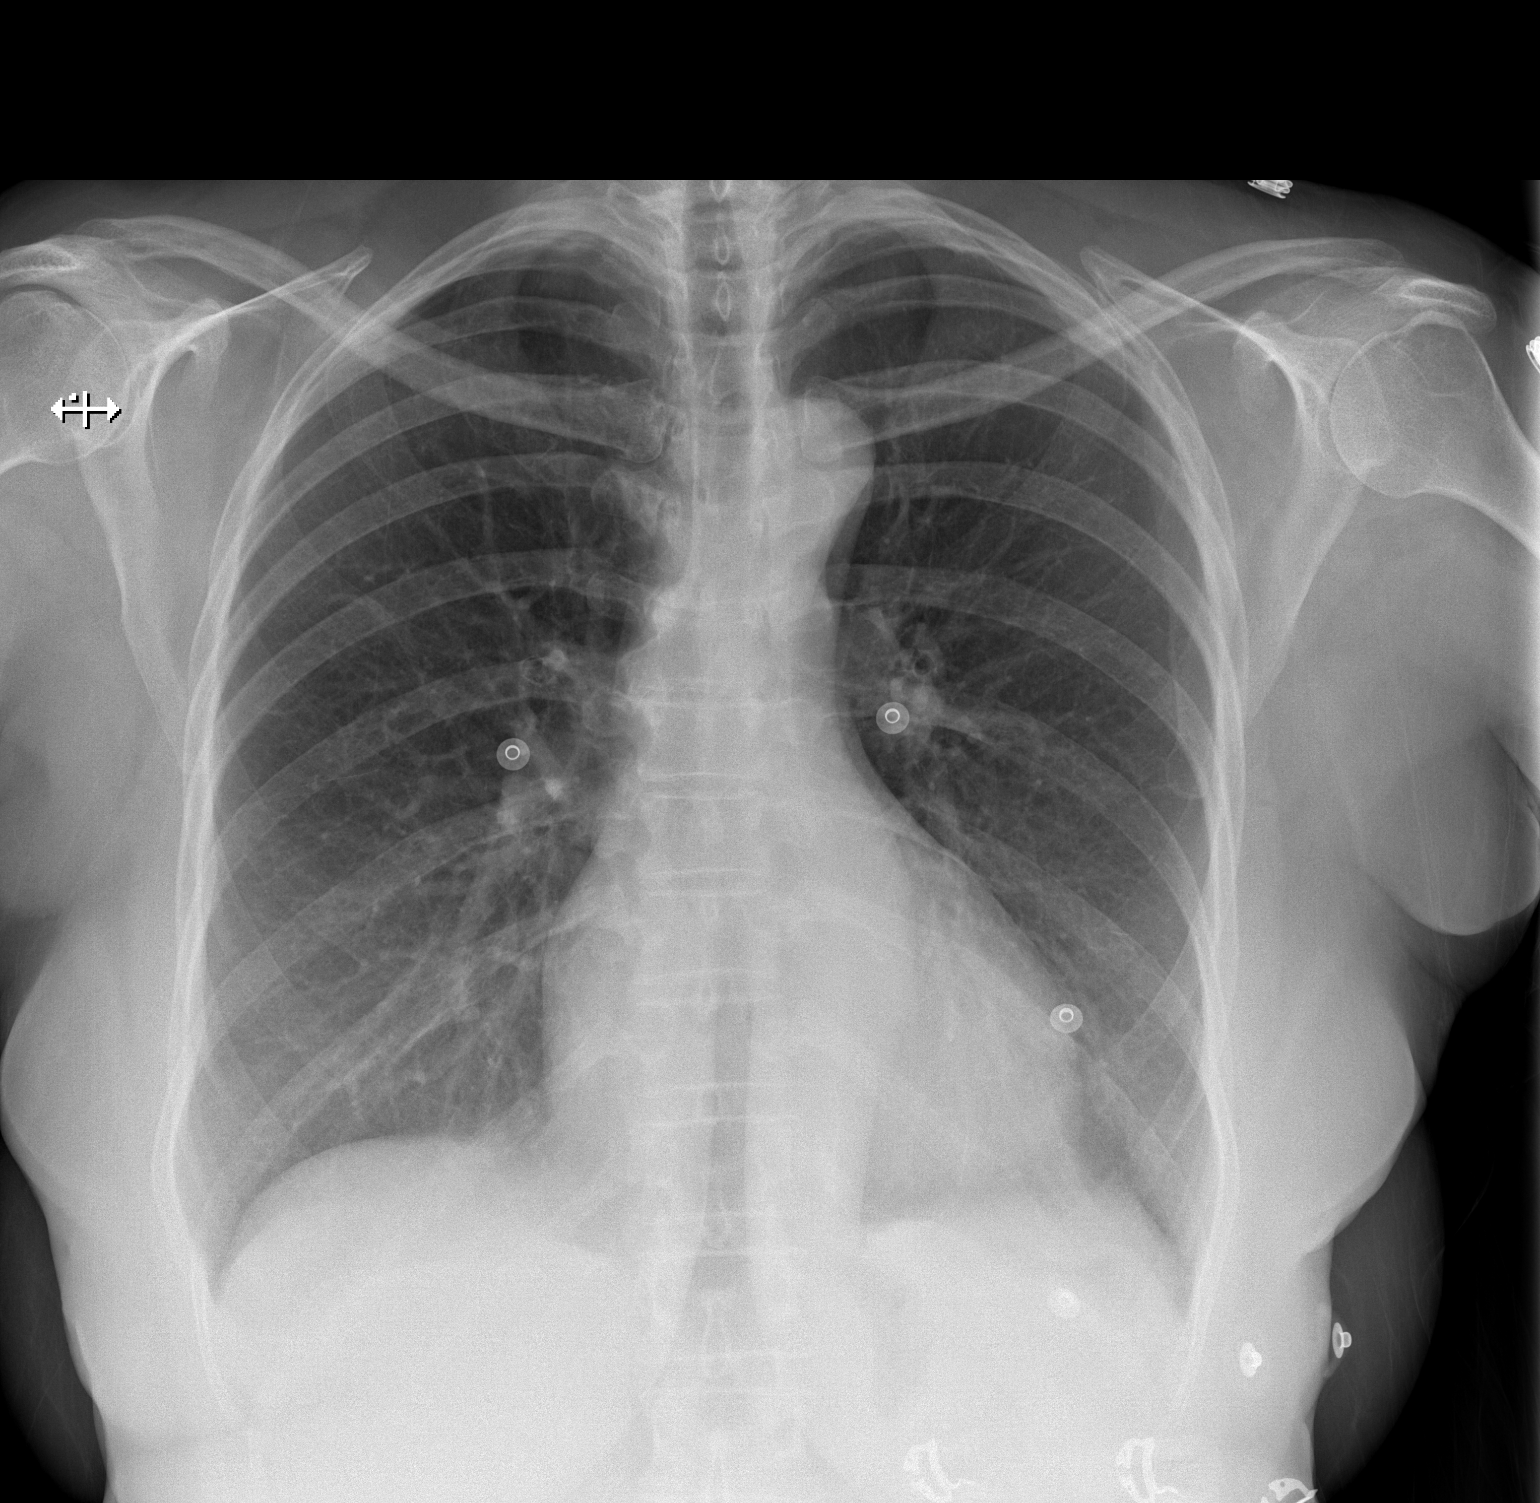

[w chest lat]
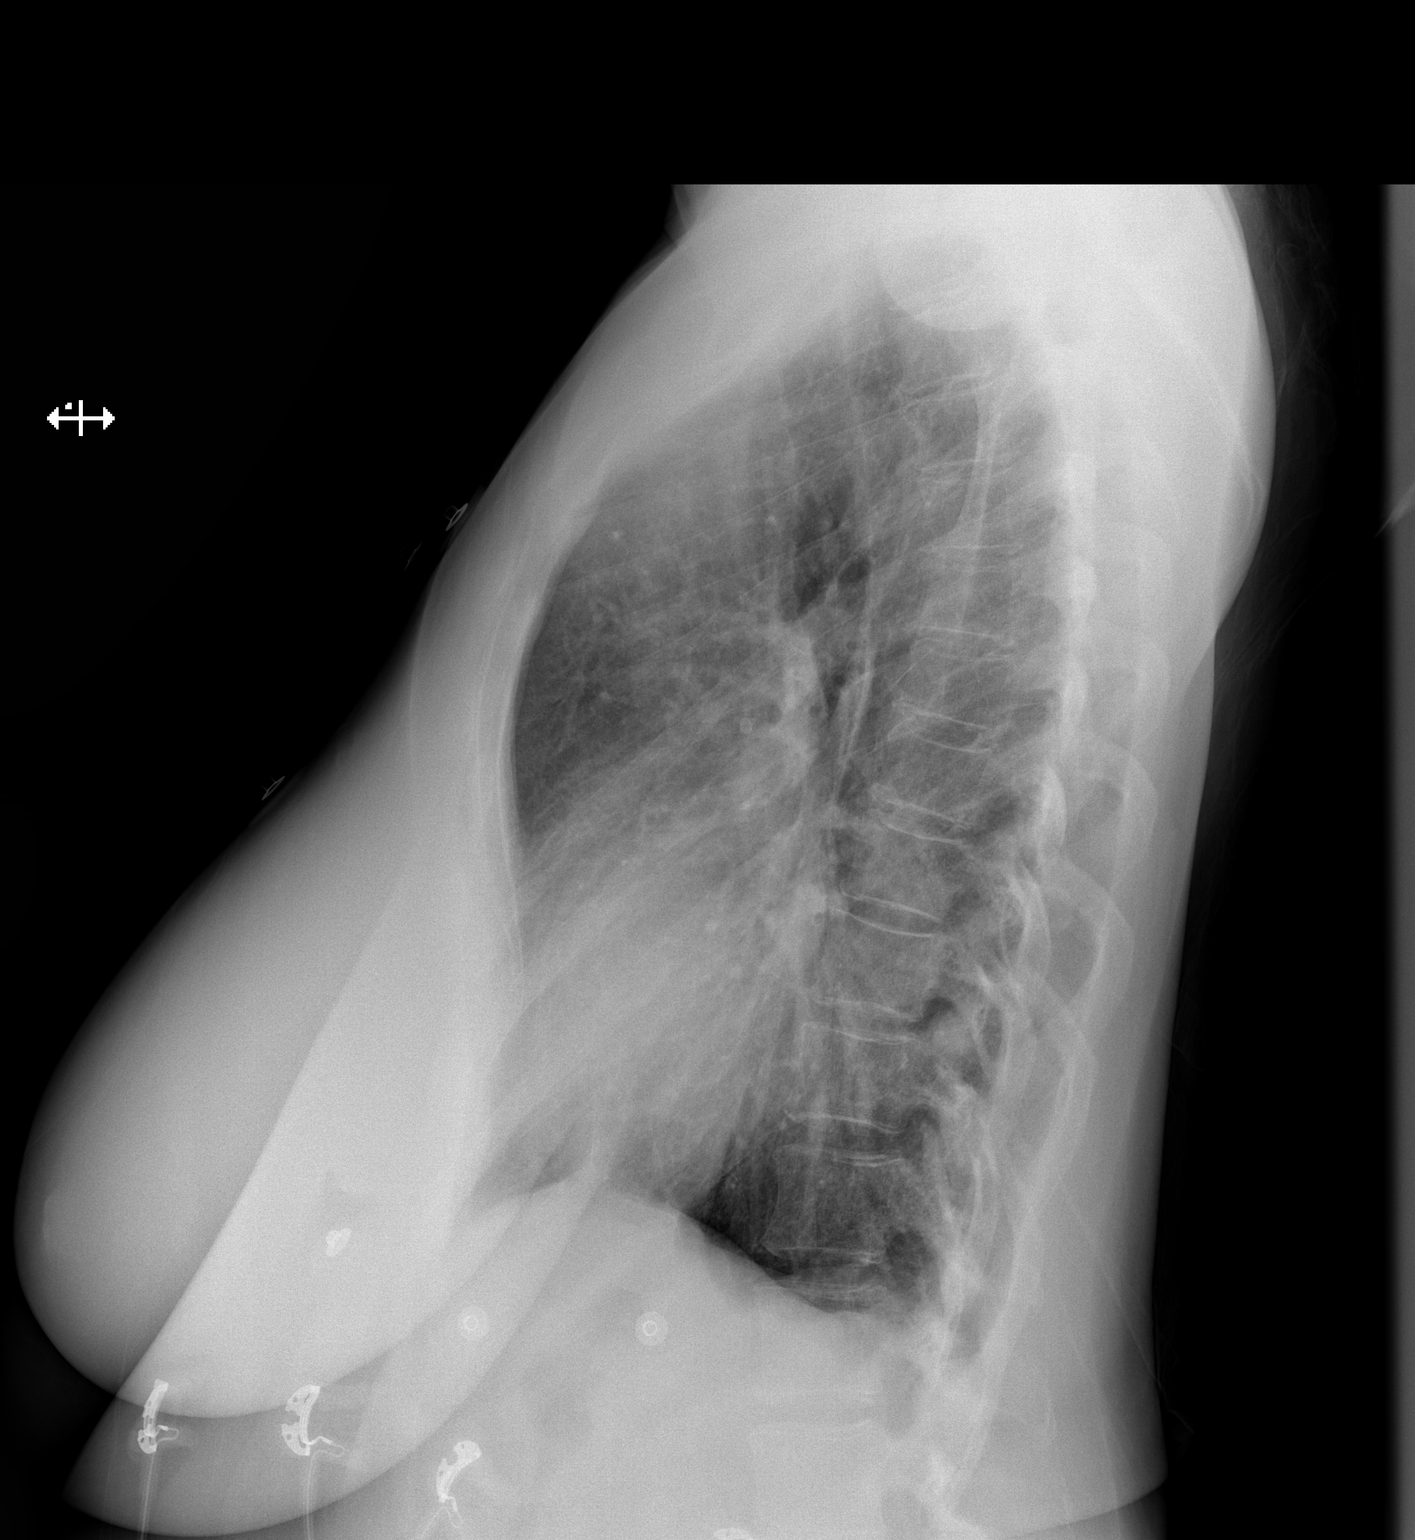

[2 of 2 positions shown; findings below may reference images not displayed]

FINDINGS: The heart size and mediastinal contours are stable. The heart size
remains accentuated by a pectus excavatum deformity. The lungs are
clear. There is no pleural effusion or pneumothorax. No acute
osseous findings are seen.
IMPRESSION: Stable chest.  No active cardiopulmonary process.

## 2022-03-05 ENCOUNTER — Other Ambulatory Visit (HOSPITAL_COMMUNITY): Payer: Self-pay | Admitting: Internal Medicine

## 2022-03-05 DIAGNOSIS — I739 Peripheral vascular disease, unspecified: Secondary | ICD-10-CM

## 2022-03-06 ENCOUNTER — Ambulatory Visit (HOSPITAL_COMMUNITY)
Admission: RE | Admit: 2022-03-06 | Discharge: 2022-03-06 | Disposition: A | Discharge status: Home / Self Care | Payer: Self-pay | Source: Ambulatory Visit | Attending: Internal Medicine | Admitting: Internal Medicine

## 2022-03-06 DIAGNOSIS — I739 Peripheral vascular disease, unspecified: Secondary | ICD-10-CM | POA: Insufficient documentation

## 2022-03-08 ENCOUNTER — Telehealth: Payer: Self-pay

## 2022-03-08 NOTE — Telephone Encounter (Signed)
Pt called stating that she had a test done the previous day and needed to give info to the person who did the test. She is best reached between 9-12.  Reviewed pt's chart, returned pt's call, two identifiers used. Pt had ABI's and wanted to let the sonographer know that she had ankle swelling and a prickly burning sensation in her legs and feet. She wasn't sure if the tech needed that info for their report. I asked if her PCP who ordered the test knew about these symptoms and she stated he did. Informed her that he would use the diagnostic test along with her symptoms to come up with a diagnosis and the tech didn't need that information to perform the test. Pt confirmed understanding.

## 2022-07-12 ENCOUNTER — Other Ambulatory Visit: Payer: Self-pay | Admitting: Internal Medicine

## 2022-07-13 LAB — COMPLETE METABOLIC PANEL WITH GFR
AG Ratio: 1.4 (calc) (ref 1.0–2.5)
ALT: 40 U/L — ABNORMAL HIGH (ref 6–29)
AST: 47 U/L — ABNORMAL HIGH (ref 10–35)
Albumin: 4.3 g/dL (ref 3.6–5.1)
Alkaline phosphatase (APISO): 74 U/L (ref 37–153)
BUN: 10 mg/dL (ref 7–25)
CO2: 24 mmol/L (ref 20–32)
Calcium: 9.5 mg/dL (ref 8.6–10.4)
Chloride: 104 mmol/L (ref 98–110)
Creat: 0.78 mg/dL (ref 0.50–1.05)
Globulin: 3 g/dL (calc) (ref 1.9–3.7)
Glucose, Bld: 103 mg/dL — ABNORMAL HIGH (ref 65–99)
Potassium: 4 mmol/L (ref 3.5–5.3)
Sodium: 140 mmol/L (ref 135–146)
Total Bilirubin: 0.5 mg/dL (ref 0.2–1.2)
Total Protein: 7.3 g/dL (ref 6.1–8.1)
eGFR: 82 mL/min/{1.73_m2} (ref >=60)

## 2022-07-13 LAB — CBC
HCT: 44.5 % (ref 35.0–45.0)
Hemoglobin: 15.1 g/dL (ref 11.7–15.5)
MCH: 30.6 pg (ref 27.0–33.0)
MCHC: 33.9 g/dL (ref 32.0–36.0)
MCV: 90.3 fL (ref 80.0–100.0)
MPV: 10.8 fL (ref 7.5–12.5)
Platelets: 186 10*3/uL (ref 140–400)
RBC: 4.93 10*6/uL (ref 3.80–5.10)
RDW: 13.2 % (ref 11.0–15.0)
WBC: 6.3 10*3/uL (ref 3.8–10.8)

## 2022-07-13 LAB — LIPID PANEL
Cholesterol: 191 mg/dL (ref <200)
HDL: 41 mg/dL — ABNORMAL LOW (ref >=50)
LDL Cholesterol (Calc): 128 mg/dL (calc) — ABNORMAL HIGH
Non-HDL Cholesterol (Calc): 150 mg/dL (calc) — ABNORMAL HIGH (ref <130)
Total CHOL/HDL Ratio: 4.7 (calc) (ref <5.0)
Triglycerides: 114 mg/dL (ref <150)

## 2022-07-13 LAB — TSH: TSH: 2.54 mIU/L (ref 0.40–4.50)

## 2022-07-13 LAB — MAGNESIUM: Magnesium: 2.1 mg/dL (ref 1.5–2.5)

## 2022-09-01 ENCOUNTER — Emergency Department (HOSPITAL_COMMUNITY)
Admission: EM | Admit: 2022-09-01 | Discharge: 2022-09-01 | Disposition: A | Discharge status: Home / Self Care | Payer: Self-pay | Attending: Emergency Medicine | Admitting: Emergency Medicine

## 2022-09-01 ENCOUNTER — Other Ambulatory Visit: Payer: Self-pay

## 2022-09-01 DIAGNOSIS — Z7729 Contact with and (suspected ) exposure to other hazardous substances: Secondary | ICD-10-CM

## 2022-09-01 DIAGNOSIS — T5891XA Toxic effect of carbon monoxide from unspecified source, accidental (unintentional), initial encounter: Secondary | ICD-10-CM | POA: Insufficient documentation

## 2022-09-01 NOTE — ED Provider Notes (Signed)
[facility]-EMERGENCY DEPT Provider Note   CSN: [id number] Arrival date & time: 09/01/22  1057     History  Chief Complaint  Patient presents with   Toxic Inhalation    Caroline Watson is a 71 y.o. female.  71 yo F with a chief complaints of carbon oxide exposure.  The patient for the past couple days has noticed a funny smell in her house.  Eventually she ended up calling the gas company who came over and told her that her common oxide level was very high and that she should come here for evaluation.  She was feeling a bit dizzy especially when she got up to walk and was having a right-sided headache.  All of her symptoms of gotten progressively better.  She felt worse this morning.  She denies any other specific thing going on with her.        Home Medications Prior to Admission medications   Medication Sig Start Date End Date Taking? Authorizing Provider  BIOTIN PO Take 2 tablets by mouth daily.    [provider]  Cholecalciferol (VITAMIN D PO) Take 1 Applicatorful by mouth daily.    [provider]  Cyanocobalamin (VITAMIN B-12 PO) Take 1 Applicatorful by mouth daily.    [provider]  Ginger, Zingiber officinalis, (GINGER PO) Take 2 tablets by mouth daily.    [provider]  Kava, Piper methysticum, (KAVA KAVA PO) Take 2 tablets by mouth daily.    [provider]  Nutritional Supplements (JUICE PLUS FIBRE PO) Take 2 tablets by mouth daily.    [provider]  TAURINE PO Take 1 tablet by mouth daily.    [provider]  TURMERIC PO Take 2 tablets by mouth daily.    [provider]      Allergies    Demerol [meperidine]    Review of Systems   Review of Systems  Physical Exam Updated Vital Signs BP 134/79 (BP Location: Left Arm)   Pulse 89   Temp 98.3 F (36.8 C) (Oral)   Resp 18   Ht 5\' 8"  (1.727 m)   Wt 81 kg   SpO2 98%   BMI 27.15 kg/m  Physical Exam Vitals  and nursing note reviewed.  Constitutional:      General: She is not in acute distress.    Appearance: She is well-developed. She is not diaphoretic.  HENT:     Head: Normocephalic and atraumatic.  Eyes:     Pupils: Pupils are equal, round, and reactive to light.  Cardiovascular:     Rate and Rhythm: Normal rate and regular rhythm.     Heart sounds: No murmur heard.    No friction rub. No gallop.  Pulmonary:     Effort: Pulmonary effort is normal.     Breath sounds: No wheezing or rales.  Abdominal:     General: There is no distension.     Palpations: Abdomen is soft.     Tenderness: There is no abdominal tenderness.  Musculoskeletal:        General: No tenderness.     Cervical back: Normal range of motion and neck supple.  Skin:    General: Skin is warm and dry.  Neurological:     Mental Status: She is alert and oriented to person, place, and time.  Psychiatric:        Behavior: Behavior normal.     ED Results / Procedures / Treatments   Labs (all  labs ordered are listed, but only abnormal results are displayed) Labs Reviewed - No data to display  EKG None  Radiology No results found.  Procedures Procedures    Medications Ordered in ED Medications - No data to display  ED Course/ Medical Decision Making/ A&P                           Medical Decision Making  71 yo F with a chief complaints of carbon oxide exposure.  It was reportedly quite elevated in her home.  Was thought to be coming from her gas furnace.  The gas was shut off and it was monitored for about an hour and is back to normal levels per the gas company.  The patient was reportedly symptomatic this morning.  She feels much better now and looks well.  No tachycardia clear lung sounds no hypoxia.  I suspect the patient will likely continue to get better.  Encouraged her to watch her symptoms at her house.  Will have her follow-up with her PCP in the office.  2:44 PM:  I have discussed the  diagnosis/risks/treatment options with the patient.  Evaluation and diagnostic testing in the emergency department does not suggest an emergent condition requiring admission or immediate intervention beyond what has been performed at this time.  They will follow up with PCP. We also discussed returning to the ED immediately if new or worsening sx occur. We discussed the sx which are most concerning (e.g., sudden worsening pain, fever, inability to tolerate by mouth) that necessitate immediate return. Medications administered to the patient during their visit and any new prescriptions provided to the patient are listed below.  Medications given during this visit Medications - No data to display   The patient appears reasonably screen and/or stabilized for discharge and I doubt any other medical condition or other [facility] requiring further screening, evaluation, or treatment in the ED at this time prior to discharge.          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

## 2022-09-01 NOTE — ED Provider Triage Note (Signed)
Emergency Medicine Provider Triage Evaluation Note  Caroline Watson , a 71 y.o. female  was evaluated in triage.  Pt complains of headache, dizziness, tachycardia symptoms morning.  Patient reports she has been exposed to CO at home.  No chest pain, shortness of breath, nausea, vomiting, bowel changes, urinary symptoms.  Review of Systems  Positive: As above Negative: As above  Physical Exam  BP (!) 156/87 (BP Location: Right Arm)   Pulse (!) 101   Temp 98.3 F (36.8 C) (Oral)   Resp 18   Ht 5\' 8"  (1.727 m)   Wt 81 kg   SpO2 99%   BMI 27.15 kg/m  Gen:   Awake, no distress   Resp:  Normal effort  MSK:   Moves extremities without difficulty  Other:    Medical Decision Making  Medically screening exam initiated at 11:44 AM.  Appropriate orders placed.  Caroline Watson was informed that the remainder of the evaluation will be completed by another provider, this initial triage assessment does not replace that evaluation, and the importance of remaining in the ED until their evaluation is complete.     Wynonia Musty, Jeanelle Malling 09/01/22 1145

## 2022-09-01 NOTE — Discharge Instructions (Signed)
Pay attention to how you feel in your house.  If your symptoms get worse while you are at home then please leave the building and call for help.

## 2022-09-01 NOTE — ED Triage Notes (Signed)
Pt reports feeling dizzy. States her furnace was leaking carbon monoxide, exposure of 12-24 hrs.
